# Patient Record
Sex: Male | Born: 1965 | Race: Asian | Hispanic: No | Marital: Married
Health system: Southern US, Community
[De-identification: ages and names within clinical notes are randomized; demographics above are authoritative.]

## PROBLEM LIST (undated history)

## (undated) ENCOUNTER — Emergency Department (HOSPITAL_COMMUNITY): Payer: Self-pay | Source: Home / Self Care

## (undated) DIAGNOSIS — S01312A Laceration without foreign body of left ear, initial encounter: Secondary | ICD-10-CM

---

## 2017-06-02 ENCOUNTER — Emergency Department (HOSPITAL_COMMUNITY): Payer: BLUE CROSS/BLUE SHIELD

## 2017-06-02 ENCOUNTER — Encounter (HOSPITAL_COMMUNITY): Payer: Self-pay

## 2017-06-02 ENCOUNTER — Observation Stay (HOSPITAL_COMMUNITY)
Admission: EM | Admit: 2017-06-02 | Discharge: 2017-06-03 | Disposition: A | Discharge status: Home / Self Care | Payer: BLUE CROSS/BLUE SHIELD | Attending: Emergency Medicine | Admitting: Emergency Medicine

## 2017-06-02 DIAGNOSIS — Z23 Encounter for immunization: Secondary | ICD-10-CM | POA: Insufficient documentation

## 2017-06-02 DIAGNOSIS — S066X9A Traumatic subarachnoid hemorrhage with loss of consciousness of unspecified duration, initial encounter: Secondary | ICD-10-CM | POA: Diagnosis present

## 2017-06-02 DIAGNOSIS — S066X0A Traumatic subarachnoid hemorrhage without loss of consciousness, initial encounter: Secondary | ICD-10-CM | POA: Diagnosis not present

## 2017-06-02 DIAGNOSIS — S066XAA Traumatic subarachnoid hemorrhage with loss of consciousness status unknown, initial encounter: Secondary | ICD-10-CM | POA: Diagnosis present

## 2017-06-02 DIAGNOSIS — S0181XA Laceration without foreign body of other part of head, initial encounter: Secondary | ICD-10-CM | POA: Insufficient documentation

## 2017-06-02 DIAGNOSIS — S01312A Laceration without foreign body of left ear, initial encounter: Secondary | ICD-10-CM | POA: Diagnosis not present

## 2017-06-02 DIAGNOSIS — S0990XA Unspecified injury of head, initial encounter: Secondary | ICD-10-CM

## 2017-06-02 DIAGNOSIS — Y92410 Unspecified street and highway as the place of occurrence of the external cause: Secondary | ICD-10-CM | POA: Diagnosis not present

## 2017-06-02 HISTORY — DX: Laceration without foreign body of left ear, initial encounter: S01.312A

## 2017-06-02 LAB — CBC WITH DIFFERENTIAL/PLATELET
BASOS ABS: 0 10*3/uL (ref 0.0–0.1)
Basophils Relative: 0 %
Eosinophils Absolute: 0.3 10*3/uL (ref 0.0–0.7)
Eosinophils Relative: 5 %
HEMATOCRIT: 45.9 % (ref 39.0–52.0)
HEMOGLOBIN: 15.8 g/dL (ref 13.0–17.0)
LYMPHS PCT: 37 %
Lymphs Abs: 2.3 10*3/uL (ref 0.7–4.0)
MCH: 31.3 pg (ref 26.0–34.0)
MCHC: 34.4 g/dL (ref 30.0–36.0)
MCV: 90.9 fL (ref 78.0–100.0)
Monocytes Absolute: 0.4 10*3/uL (ref 0.1–1.0)
Monocytes Relative: 6 %
Neutro Abs: 3.1 10*3/uL (ref 1.7–7.7)
Neutrophils Relative %: 52 %
Platelets: 127 10*3/uL — ABNORMAL LOW (ref 150–400)
RBC: 5.05 MIL/uL (ref 4.22–5.81)
RDW: 13.1 % (ref 11.5–15.5)
WBC: 6.1 10*3/uL (ref 4.0–10.5)

## 2017-06-02 LAB — BASIC METABOLIC PANEL
ANION GAP: 9 (ref 5–15)
BUN: 17 mg/dL (ref 6–20)
CO2: 22 mmol/L (ref 22–32)
Calcium: 9 mg/dL (ref 8.9–10.3)
Chloride: 106 mmol/L (ref 101–111)
Creatinine, Ser: 1.11 mg/dL (ref 0.61–1.24)
GFR calc Af Amer: >60 mL/min (ref >60)
Glucose, Bld: 108 mg/dL — ABNORMAL HIGH (ref 65–99)
POTASSIUM: 3.4 mmol/L — AB (ref 3.5–5.1)
Sodium: 137 mmol/L (ref 135–145)

## 2017-06-02 LAB — RAPID URINE DRUG SCREEN, HOSP PERFORMED
AMPHETAMINES: NOT DETECTED
BARBITURATES: NOT DETECTED
Benzodiazepines: NOT DETECTED
COCAINE: NOT DETECTED
Opiates: NOT DETECTED
TETRAHYDROCANNABINOL: NOT DETECTED

## 2017-06-02 LAB — URINALYSIS, ROUTINE W REFLEX MICROSCOPIC
BILIRUBIN URINE: NEGATIVE
GLUCOSE, UA: NEGATIVE mg/dL
HGB URINE DIPSTICK: NEGATIVE
Ketones, ur: NEGATIVE mg/dL
Leukocytes, UA: NEGATIVE
Nitrite: NEGATIVE
PROTEIN: NEGATIVE mg/dL
Specific Gravity, Urine: 1.016 (ref 1.005–1.030)
pH: 6 (ref 5.0–8.0)

## 2017-06-02 MED ORDER — SODIUM CHLORIDE 0.9 % IV SOLN
500.0000 mg | Freq: Two times a day (BID) | INTRAVENOUS | Status: DC
  Administered 2017-06-03: 500 mg via INTRAVENOUS
  Filled 2017-06-02 (×2): qty 5

## 2017-06-02 MED ORDER — TETANUS-DIPHTHERIA TOXOIDS TD 5-2 LFU IM INJ: 0.5000 mL | INJECTION | Freq: Once | INTRAMUSCULAR | Status: DC

## 2017-06-02 MED ORDER — SODIUM CHLORIDE 0.9 % IV SOLN: 250.0000 mL | INTRAVENOUS | Status: DC | PRN

## 2017-06-02 MED ORDER — HYDROMORPHONE HCL 1 MG/ML IJ SOLN: 0.5000 mg | INTRAMUSCULAR | Status: DC | PRN

## 2017-06-02 MED ORDER — LIDOCAINE-EPINEPHRINE (PF) 2 %-1:200000 IJ SOLN
10.0000 mL | Freq: Once | INTRAMUSCULAR | Status: AC
  Administered 2017-06-02: 10 mL
  Filled 2017-06-02: qty 20

## 2017-06-02 MED ORDER — ACETAMINOPHEN 325 MG PO TABS: 650.0000 mg | ORAL_TABLET | ORAL | Status: DC | PRN

## 2017-06-02 MED ORDER — HYDRALAZINE HCL 20 MG/ML IJ SOLN: 10.0000 mg | INTRAMUSCULAR | Status: DC | PRN

## 2017-06-02 MED ORDER — ONDANSETRON 4 MG PO TBDP: 4.0000 mg | ORAL_TABLET | Freq: Four times a day (QID) | ORAL | Status: DC | PRN

## 2017-06-02 MED ORDER — METOPROLOL TARTRATE 5 MG/5ML IV SOLN: 5.0000 mg | Freq: Four times a day (QID) | INTRAVENOUS | Status: DC | PRN

## 2017-06-02 MED ORDER — OXYCODONE HCL 5 MG PO TABS: 5.0000 mg | ORAL_TABLET | ORAL | Status: DC | PRN

## 2017-06-02 MED ORDER — TETANUS-DIPHTH-ACELL PERTUSSIS 5-2.5-18.5 LF-MCG/0.5 IM SUSP
0.5000 mL | Freq: Once | INTRAMUSCULAR | Status: AC
  Administered 2017-06-02: 0.5 mL via INTRAMUSCULAR
  Filled 2017-06-02: qty 0.5

## 2017-06-02 MED ORDER — DOCUSATE SODIUM 100 MG PO CAPS: 100.0000 mg | ORAL_CAPSULE | Freq: Two times a day (BID) | ORAL | Status: DC

## 2017-06-02 MED ORDER — ONDANSETRON HCL 4 MG/2ML IJ SOLN: 4.0000 mg | Freq: Four times a day (QID) | INTRAMUSCULAR | Status: DC | PRN

## 2017-06-02 MED ORDER — SODIUM CHLORIDE 0.9% FLUSH: 3.0000 mL | INTRAVENOUS | Status: DC | PRN

## 2017-06-02 MED ORDER — SODIUM CHLORIDE 0.9% FLUSH: 3.0000 mL | Freq: Two times a day (BID) | INTRAVENOUS | Status: DC
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
## 2017-06-02 NOTE — ED Notes (Signed)
Md made aware of patient c/o acute chest pain; new orders placed

## 2017-06-03 ENCOUNTER — Encounter (HOSPITAL_COMMUNITY): Payer: Self-pay | Admitting: General Surgery

## 2017-06-03 ENCOUNTER — Observation Stay (HOSPITAL_COMMUNITY): Payer: BLUE CROSS/BLUE SHIELD

## 2017-06-03 DIAGNOSIS — S066X0A Traumatic subarachnoid hemorrhage without loss of consciousness, initial encounter: Secondary | ICD-10-CM | POA: Diagnosis not present

## 2017-06-03 DIAGNOSIS — S0181XA Laceration without foreign body of other part of head, initial encounter: Secondary | ICD-10-CM | POA: Insufficient documentation

## 2017-06-03 DIAGNOSIS — S01312A Laceration without foreign body of left ear, initial encounter: Secondary | ICD-10-CM

## 2017-06-03 HISTORY — DX: Laceration without foreign body of left ear, initial encounter: S01.312A

## 2017-06-03 LAB — CBC
HCT: 41.8 % (ref 39.0–52.0)
Hemoglobin: 14.3 g/dL (ref 13.0–17.0)
MCH: 30.9 pg (ref 26.0–34.0)
MCHC: 34.2 g/dL (ref 30.0–36.0)
MCV: 90.3 fL (ref 78.0–100.0)
PLATELETS: 145 10*3/uL — AB (ref 150–400)
RBC: 4.63 MIL/uL (ref 4.22–5.81)
RDW: 13.2 % (ref 11.5–15.5)
WBC: 8.1 10*3/uL (ref 4.0–10.5)

## 2017-06-03 LAB — BASIC METABOLIC PANEL
ANION GAP: 10 (ref 5–15)
BUN: 12 mg/dL (ref 6–20)
CALCIUM: 9.1 mg/dL (ref 8.9–10.3)
CO2: 23 mmol/L (ref 22–32)
Chloride: 106 mmol/L (ref 101–111)
Creatinine, Ser: 0.97 mg/dL (ref 0.61–1.24)
GFR calc Af Amer: >60 mL/min (ref >60)
GLUCOSE: 90 mg/dL (ref 65–99)
POTASSIUM: 3.7 mmol/L (ref 3.5–5.1)
SODIUM: 139 mmol/L (ref 135–145)

## 2017-06-03 LAB — TROPONIN I

## 2017-06-03 LAB — HIV ANTIBODY (ROUTINE TESTING W REFLEX): HIV Screen 4th Generation wRfx: NONREACTIVE

## 2017-06-03 MED ORDER — LEVETIRACETAM 500 MG PO TABS
500.0000 mg | ORAL_TABLET | Freq: Two times a day (BID) | ORAL | Status: DC
  Administered 2017-06-03: 500 mg via ORAL
  Filled 2017-06-03: qty 1

## 2017-06-03 MED ORDER — LEVETIRACETAM 500 MG PO TABS: 500.0000 mg | ORAL_TABLET | Freq: Two times a day (BID) | ORAL | 0 refills | Status: DC

## 2017-06-03 MED ORDER — LEVETIRACETAM 500 MG PO TABS: 500.0000 mg | ORAL_TABLET | Freq: Two times a day (BID) | ORAL | 0 refills | Status: AC

## 2017-06-03 MED ORDER — ONDANSETRON 4 MG PO TBDP: 4.0000 mg | ORAL_TABLET | Freq: Four times a day (QID) | ORAL | 0 refills | Status: AC | PRN

## 2017-06-03 MED ORDER — ONDANSETRON 4 MG PO TBDP
4.0000 mg | ORAL_TABLET | Freq: Four times a day (QID) | ORAL | 0 refills | Status: DC | PRN
Start: 2017-06-03 — End: 2017-06-03

## 2017-06-03 MED ORDER — IOPAMIDOL (ISOVUE-300) INJECTION 61%
INTRAVENOUS | Status: AC
  Administered 2017-06-03: 100 mL
  Filled 2017-06-03: qty 100
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
## 2017-06-03 NOTE — ED Notes (Signed)
Patient transported to CT 
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
## 2017-06-03 NOTE — Discharge Instructions (Signed)
Concussion, Adult A concussion is a brain injury from a direct hit (blow) to the head or body. This blow causes the brain to shake quickly back and forth inside the skull. This can damage brain cells and cause chemical changes in the brain. A concussion may also be known as a mild traumatic brain injury (TBI). Concussions are usually not life-threatening, but the effects of a concussion can be serious. If you have a concussion, you are more likely to experience concussion-like symptoms after a direct blow to the head in the future. What are the causes? This condition is caused by:  A direct blow to the head, such as from running into another player during a game, being hit in a fight, or hitting your head on a hard surface.  A jolt of the head or neck that causes the brain to move back and forth inside the skull, such as in a car crash.  What are the signs or symptoms? The signs of a concussion can be hard to notice. Early on, they may be missed by you, family members, and health care providers. You may look fine but act or feel differently. Symptoms are usually temporary, but they may last for days, weeks, or even longer. Some symptoms may appear right away but other symptoms may not show up for hours or days. Every head injury is different. Symptoms may include:  Headaches. This can include a feeling of pressure in the head.  Memory problems.  Trouble concentrating, organizing, or making decisions.  Slowness in thinking, acting or reacting, speaking, or reading.  Confusion.  Fatigue.  Changes in eating or sleeping patterns.  Problems with coordination or balance.  Nausea or vomiting.  Numbness or tingling.  Sensitivity to light or noise.  Vision or hearing problems.  Reduced sense of smell.  Irritability or mood changes.  Dizziness.  Lack of motivation.  Seeing or hearing things that other people do not see or hear (hallucinations).  How is this diagnosed? This  condition is diagnosed based on:  Your symptoms.  A description of your injury.  You may also have tests, including:  Imaging tests, such as a CT scan or MRI. These are done to look for signs of brain injury.  Neuropsychological tests. These measure your thinking, understanding, learning, and remembering abilities.  How is this treated? This condition is treated with physical and mental rest and careful observation, usually at home. If the concussion is severe, you may need to stay home from work for a while. You may be referred to a concussion clinic or to other health care providers for management. It is important that you tell your health care provider if:  You are taking any medicines, including prescription medicines, over-the-counter medicines, and natural remedies. Some medicines, such as blood thinners (anticoagulants) and aspirin, may increase the chance of complications, such as bleeding.  You are taking or have taken alcohol or illegal drugs. Alcohol and certain other drugs may slow your recovery and can put you at risk of further injury.  How fast you will recover from a concussion depends on many factors, such as how severe your concussion is, what part of your brain was injured, how old you are, and how healthy you were before the concussion. Recovery can take time. It is important to wait to return to activity until a health care provider says it is safe to do that and your symptoms are completely gone. Follow these instructions at home: Activity  Limit activities that  require a lot of thought or concentration. These may include: ? Doing homework or job-related work. ? Watching TV. ? Working on the computer. ? Playing memory games and puzzles.  Rest. Rest helps the brain to heal. Make sure you: ? Get plenty of sleep at night. Avoid staying up late at night. ? Keep the same bedtime hours on weekends and weekdays. ? Rest during the day. Take naps or rest breaks when you  feel tired.  Having another concussion before the first one has healed can be dangerous. Do not do high-risk activities that could cause a second concussion, such as riding a bicycle or playing sports.  Ask your health care provider when you can return to your normal activities, such as school, work, athletics, driving, riding a bicycle, or using heavy machinery. Your ability to react may be slower after a brain injury. Never do these activities if you are dizzy. Your health care provider will likely give you a plan for gradually returning to activities. General instructions  Take over-the-counter and prescription medicines only as told by your health care provider.  Do not drink alcohol until your health care provider says you can.  If it is harder than usual to remember things, write them down.  If you are easily distracted, try to do one thing at a time. For example, do not try to watch TV while fixing dinner.  Talk with family members or close friends when making important decisions.  Watch your symptoms and tell others to do the same. Complications sometimes occur after a concussion. Older adults with a brain injury may have a higher risk of serious complications, such as a blood clot in the brain.  Tell your teachers, school nurse, school counselor, coach, athletic trainer, or work Production designer, theatre/television/filmmanager about your injury, symptoms, and restrictions. Tell them about what you can or cannot do. They should watch for: ? Increased problems with attention or concentration. ? Increased difficulty remembering or learning new information. ? Increased time needed to complete tasks or assignments. ? Increased irritability or decreased ability to cope with stress. ? Increased symptoms.  Keep all follow-up visits as told by your health care provider. This is important. How is this prevented? It is very important to avoid another brain injury, especially as you recover. In rare cases, another injury can lead  to permanent brain damage, brain swelling, or death. The risk of this is greatest during the first 7-10 days after a head injury. Avoid injuries by:  Wearing a seat belt when riding in a car.  Wearing a helmet when biking, skiing, skateboarding, skating, or doing similar activities.  Avoiding activities that could lead to a second concussion, such as contact or recreational sports, until your health care provider says it is okay.  Taking safety measures in your home, such as: ? Removing clutter and tripping hazards from floors and stairways. ? Using grab bars in bathrooms and handrails by stairs. ? Placing non-slip mats on floors and in bathtubs. ? Improving lighting in dim areas.  Contact a health care provider if:  Your symptoms get worse.  You have new symptoms.  You continue to have symptoms for more than 2 weeks. Get help right away if:  You have severe or worsening headaches.  You have weakness or numbness in any part of your body.  Your coordination gets worse.  You vomit repeatedly.  You are sleepier.  The pupil of one eye is larger than the other.  You have convulsions or a  seizure.  Your speech is slurred.  Your fatigue, confusion, or irritability gets worse.  You cannot recognize people or places.  You have neck pain.  It is difficult to wake you up.  You have unusual behavior changes.  You lose consciousness. Summary  A concussion is a brain injury from a direct hit (blow) to the head or body.  A concussion may also be called a mild traumatic brain injury (TBI).  You may have imaging tests and neuropsychological tests to diagnose a concussion.  This condition is treated with physical and mental rest and careful observation.  Ask your health care provider when you can return to your normal activities, such as school, work, athletics, driving, riding a bicycle, or using heavy machinery. Follow safety instructions as told by your health care  provider. This information is not intended to replace advice given to you by your health care provider. Make sure you discuss any questions you have with your health care provider. Document Released: 09/28/2003 Document Revised: 06/18/2016 Document Reviewed: 06/18/2016 [employer] Interactive Patient Education  2017 [employer]   Sutured Wound Care Sutures are stitches that can be used to close wounds. Taking care of your wound properly can help to prevent pain and infection. It can also help your wound to heal more quickly. How is this treated? Wound Care  Keep the wound clean and dry.  If you were given a bandage (dressing), you should change it at least once per day or as directed by your health care provider. You should also change it if it becomes wet or dirty.  Keep the wound completely dry for the first 24 hours or as directed by your health care provider. After that time, you may shower or bathe. However, make sure that the wound is not soaked in water until the sutures have been removed.  Clean the wound one time each day or as directed by your health care provider. ? Wash the wound with soap and water. ? Rinse the wound with water to remove all soap. ? Pat the wound dry with a clean towel. Do not rub the wound.  Aftercleaning the wound, apply a thin layer of antibioticointment as directed by your health care provider. This will help to prevent infection and keep the dressing from sticking to the wound.  Have the sutures removed as directed by your health care provider. General Instructions  Take or apply medicines only as directed by your health care provider.  To help prevent scarring, make sure to cover your wound with sunscreen whenever you are outside after the sutures are removed and the wound is healed. Make sure to wear a sunscreen of at least 30 SPF.  If you were prescribed an antibiotic medicine or ointment, finish all of it even if you start to feel better.  Do  not scratch or pick at the wound.  Keep all follow-up visits as directed by your health care provider. This is important.  Check your wound every day for signs of infection. Watch for: ? Redness, swelling, or pain. ? Fluid, blood, or pus.  Raise (elevate) the injured area above the level of your heart while you are sitting or lying down, if possible.  Avoid stretching your wound.  Drink enough fluids to keep your urine clear or pale yellow. Contact a health care provider if:  You received a tetanus shot and you have swelling, severe pain, redness, or bleeding at the injection site.  You have a fever.  A wound  that was closed breaks open.  You notice a bad smell coming from the wound.  You notice something coming out of the wound, such as wood or glass.  Your pain is not controlled with medicine.  You have increased redness, swelling, or pain at the site of your wound.  You have fluid, blood, or pus coming from your wound.  You notice a change in the color of your skin near your wound.  You need to change the dressing frequently due to fluid, blood, or pus draining from the wound.  You develop a new rash.  You develop numbness around the wound. Get help right away if:  You develop severe swelling around the injury site.  Your pain suddenly increases and is severe.  You develop painful lumps near the wound or on skin that is anywhere on your body.  You have a red streak going away from your wound.  The wound is on your hand or foot and you cannot properly move a finger or toe.  The wound is on your hand or foot and you notice that your fingers or toes look pale or bluish. This information is not intended to replace advice given to you by your health care provider. Make sure you discuss any questions you have with your health care provider. Document Released: 08/15/2004 Document Revised: 12/14/2015 Document Reviewed: 02/17/2013 [employer] Interactive Patient Education   2017 [employer]  HAVE YOUR SUTURES REMOVED IN 5-7 DAYS AT YOUR DOCTOR'S OFFICE

## 2017-06-03 NOTE — ED Notes (Addendum)
Patient placed on Hospital bed for comfort; Pt able to ambulate to bed w/o assistance
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
